# Patient Record
Sex: Male | Born: 1975 | Race: White | Hispanic: No | Marital: Married | Smoking: Never smoker
Health system: Southern US, Community
[De-identification: ages and names within clinical notes are randomized; demographics above are authoritative.]

## PROBLEM LIST (undated history)

## (undated) DIAGNOSIS — F419 Anxiety disorder, unspecified: Secondary | ICD-10-CM

## (undated) DIAGNOSIS — J302 Other seasonal allergic rhinitis: Secondary | ICD-10-CM

## (undated) DIAGNOSIS — G4730 Sleep apnea, unspecified: Secondary | ICD-10-CM

## (undated) HISTORY — DX: Other seasonal allergic rhinitis: J30.2

## (undated) HISTORY — DX: Anxiety disorder, unspecified: F41.9

## (undated) HISTORY — DX: Sleep apnea, unspecified: G47.30

---

## 2017-08-31 ENCOUNTER — Encounter: Payer: Self-pay | Admitting: Emergency Medicine

## 2017-08-31 ENCOUNTER — Emergency Department (INDEPENDENT_AMBULATORY_CARE_PROVIDER_SITE_OTHER): Payer: BLUE CROSS/BLUE SHIELD

## 2017-08-31 ENCOUNTER — Emergency Department (INDEPENDENT_AMBULATORY_CARE_PROVIDER_SITE_OTHER)
Admission: EM | Admit: 2017-08-31 | Discharge: 2017-08-31 | Disposition: A | Discharge status: Home / Self Care | Payer: BLUE CROSS/BLUE SHIELD | Source: Home / Self Care

## 2017-08-31 DIAGNOSIS — X58XXXA Exposure to other specified factors, initial encounter: Secondary | ICD-10-CM | POA: Diagnosis not present

## 2017-08-31 DIAGNOSIS — S99922A Unspecified injury of left foot, initial encounter: Secondary | ICD-10-CM

## 2017-08-31 MED ORDER — IBUPROFEN 800 MG PO TABS: 800.0000 mg | ORAL_TABLET | Freq: Two times a day (BID) | ORAL | 0 refills | Status: DC | PRN

## 2017-08-31 NOTE — Discharge Instructions (Addendum)
Imaging today was negative for fracture. I recommend that you continue to apply ice and taken ibuprofen 800 mg twice daily as needed for pain and to reduce inflammation associated with injury. If no improvement, return for follow-up. Take ibuprofen no more than 5 days as there is a risk of GI bleeding associated with prolonged intake of NSAIDS especially at higher doses.

## 2017-08-31 NOTE — ED Triage Notes (Signed)
Pt c/o left big toe injury x 2 days ago, bruised and painful, can move toe, taking Ibuprofen.

## 2017-08-31 NOTE — ED Provider Notes (Signed)
[facility]    CSN: [id number] Arrival date & time: 08/31/17  1213   History   Chief Complaint Chief Complaint  Patient presents with  . Toe Injury   HPI Samuel Frost is a 42 y.o. male since for evaluation of a left great toe injury which occurred 2 days ago. Patient reports while washing his car he lost his balance and in order to brace himself to prevent a fall he inadvertently hit his great toe on the cement.  He noticed over the course of the last 2 days increasing darkening of his great toe below the cuticle area extending down the entire toe. He is able to move great toe although reports the sensation of tightness and characterizes pain as "numbing pain" which is tolerable during the day with weightbearing and activity  throughout the day, pain worsens at night. He has taken ibuprofen once with relief of pain. His major concern is today is ruling out a toe fracture. Home Medications    Prior to Admission medications   Not on File   Family History No family history on file.  Social History Social History   Tobacco Use  . Smoking status: Not on file  Substance Use Topics  . Alcohol use: Not on file  . Drug use: Not on file     Allergies   Patient has no known allergies.   Review of Systems Review of Systems Pertinent negatives listed in HPI  Physical Exam Triage Vital Signs ED Triage Vitals [08/31/17 1255]  Enc Vitals Group     BP 120/80     Pulse Rate 79     Resp      Temp 98.5 F (36.9 C)     Temp Source Oral     SpO2 99 %     Weight 286 lb 8 oz (130 kg)     Height 6' (1.829 m)     Head Circumference      Peak Flow      Pain Score 6     Pain Loc      Pain Edu?      Excl. in [place]?    No data found.  Updated Vital Signs BP 120/80 (BP Location: Right Arm)   Pulse 79   Temp 98.5 F (36.9 C) (Oral)   Ht 6' (1.829 m)   Wt 286 lb 8 oz (130 kg)   SpO2 99%   BMI 38.86 kg/m   Visual Acuity Right Eye Distance:   Left Eye Distance:     Bilateral Distance:    Right Eye Near:   Left Eye Near:    Bilateral Near:     Physical Exam  Constitutional: He appears well-developed and well-nourished.  Cardiovascular: Normal rate.  Pulmonary/Chest: Effort normal.  Musculoskeletal:       Feet:     UC Treatments / Results  Labs (all labs ordered are listed, but only abnormal results are displayed) Labs Reviewed - No data to display  EKG None  Radiology Dg Toe Great Left  Result Date: 08/31/2017 CLINICAL DATA:  Left great toe injury. EXAM: LEFT GREAT TOE COMPARISON:  None. FINDINGS: There is no evidence of fracture or dislocation. There is no evidence of arthropathy or other focal bone abnormality. Soft tissues are unremarkable. IMPRESSION: Negative. Electronically Signed   By: Monte Fantasia M.D.   On: 08/31/2017 13:42    Procedures Procedures (including critical care time)  Medications Ordered in UC Medications - No data to display  Initial Impression / Assessment and Plan / UC Course  I have reviewed the triage vital signs and the nursing notes.  Pertinent labs & imaging results that were available during my care of the patient were reviewed by me and considered in my medical decision making (see chart for details).  Patient presents today concern for right great toe fracture following an injury he sustained during a recent fall 2 days prior. Toe has visible light colored bruising,  Although toe  is unremarkable for swelling. Toe is warm indicating appropriate circulatory status. Imaging of right great toe was negative for fracture. Patient is able to walk with complete weight-bearing without pain. Main concern today was ruling out fracture. I recommend ice applications to toe, ibuprofen 800 mg twice daily for next 5 days as needed for pain and inflammation (may discontinue if pain resolves in less than 5 days). Continue weight-bearing as tolerated.   Final Clinical Impressions(s) / UC Diagnoses   Final  diagnoses:  Injury of toe on left foot, initial encounter     Discharge Instructions     Imaging today was negative for fracture. I recommend that you continue to apply ice and taken ibuprofen 800 mg twice daily as needed for pain and to reduce inflammation associated with injury. If no improvement, return for follow-up. Take ibuprofen no more than 5 days as there is a risk of GI bleeding associated with prolonged intake of NSAIDS especially at higher doses.    ED Prescriptions    Medication Sig Dispense Auth. Provider   ibuprofen (ADVIL,MOTRIN) 800 MG tablet Take 1 tablet (800 mg total) by mouth 2 (two) times daily as needed. 20 tablet Scot Jun, FNP     Controlled Substance Prescriptions [place] Controlled Substance Registry consulted? No   Scot Jun, Trainer 09/01/17 1103

## 2018-02-04 HISTORY — PX: BRAIN AVM REPAIR: SHX202

## 2019-01-19 IMAGING — DX DG TOE GREAT 2+V*L*
3 series · 3 of 3 positions shown · non-contrast
Comparison: None.

CLINICAL DATA: Left great toe injury.

EXAM:
LEFT GREAT TOE

[toe ap]
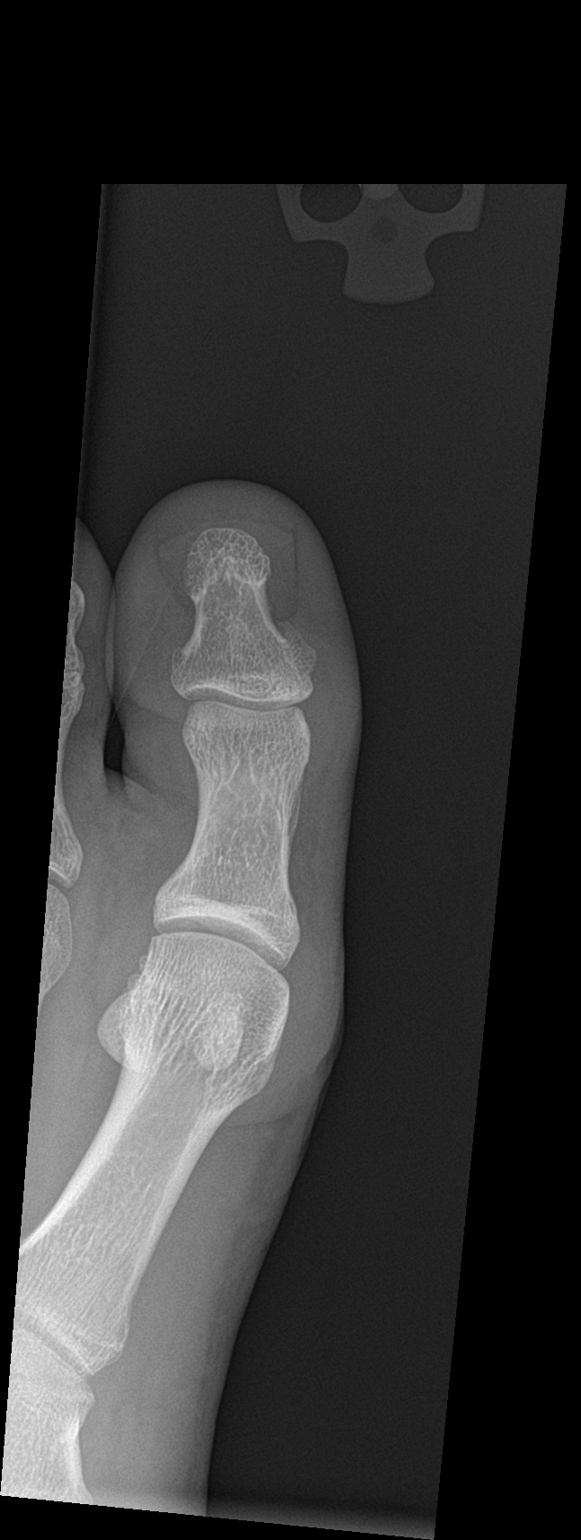

[toe obl]
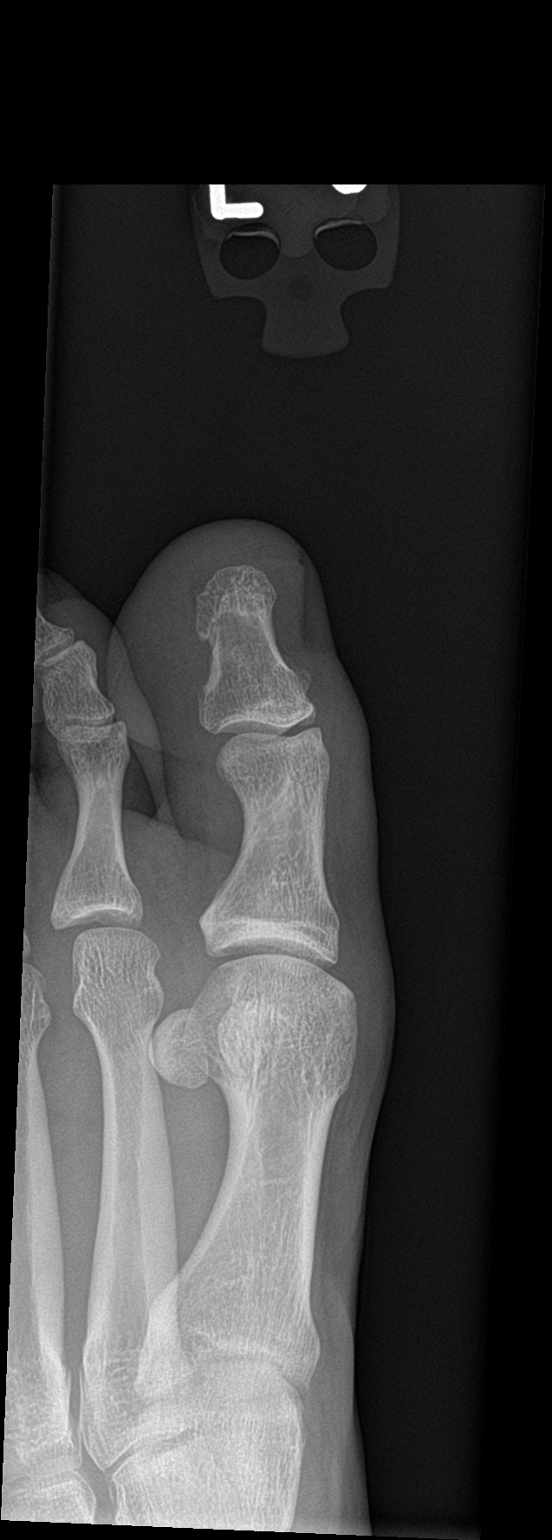

[toe lat]
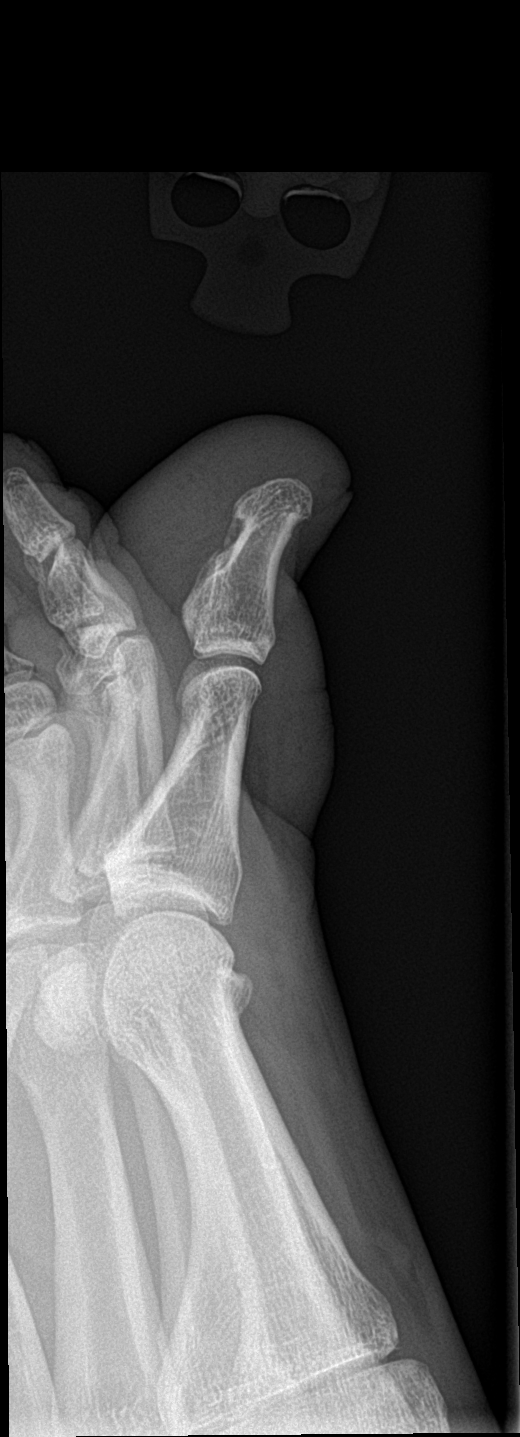

[3 of 3 positions shown; findings below may reference images not displayed]

FINDINGS: There is no evidence of fracture or dislocation. There is no
evidence of arthropathy or other focal bone abnormality. Soft
tissues are unremarkable.
IMPRESSION: Negative.

## 2020-02-05 HISTORY — PX: LIPOMA EXCISION: SHX5283

## 2021-11-09 ENCOUNTER — Encounter: Payer: Self-pay | Admitting: Gastroenterology

## 2021-11-28 ENCOUNTER — Ambulatory Visit (AMBULATORY_SURGERY_CENTER): Payer: Self-pay

## 2021-11-28 VITALS — Ht 72.0 in | Wt 282.0 lb

## 2021-11-28 DIAGNOSIS — Z1211 Encounter for screening for malignant neoplasm of colon: Secondary | ICD-10-CM

## 2021-11-28 MED ORDER — NA SULFATE-K SULFATE-MG SULF 17.5-3.13-1.6 GM/177ML PO SOLN: 1.0000 | Freq: Once | ORAL | 0 refills | Status: AC

## 2021-11-28 NOTE — Progress Notes (Signed)
No egg or soy allergy known to patient;  No issues known to pt with past sedation with any surgeries or procedures; Patient denies ever being told they had issues or difficulty with intubation;  No FH of Malignant Hyperthermia; Pt is not on diet pills; Pt is not on home 02;  Pt is not on blood thinners;  Pt denies issues with constipation;  No A fib or A flutter; Have any cardiac testing pending--NO Pt instructed to use Singlecare[employer]com or GoodRx for a price reduction on prep;   Insurance verified during PV appt=[employer]  Patient's chart reviewed by John Nulty CNRA prior to previsit and patient appropriate for the [facility].  Previsit completed and red dot placed by patient's name on their procedure day (on provider's schedule).     

## 2021-12-06 ENCOUNTER — Encounter: Payer: Self-pay | Admitting: Gastroenterology

## 2021-12-13 ENCOUNTER — Encounter: Payer: Self-pay | Admitting: Gastroenterology

## 2021-12-13 ENCOUNTER — Ambulatory Visit (AMBULATORY_SURGERY_CENTER): Payer: BC Managed Care – PPO | Admitting: Gastroenterology

## 2021-12-13 VITALS — BP 120/84 | HR 71 | Temp 97.5°F | Resp 20 | Ht 72.0 in | Wt 282.0 lb

## 2021-12-13 DIAGNOSIS — Z1211 Encounter for screening for malignant neoplasm of colon: Secondary | ICD-10-CM | POA: Diagnosis not present

## 2021-12-13 DIAGNOSIS — D124 Benign neoplasm of descending colon: Secondary | ICD-10-CM | POA: Diagnosis not present

## 2021-12-13 MED ORDER — SODIUM CHLORIDE 0.9 % IV SOLN: 500.0000 mL | Freq: Once | INTRAVENOUS | Status: DC

## 2021-12-13 NOTE — Progress Notes (Signed)
Called to room to assist during endoscopic procedure.  Patient ID and intended procedure confirmed with present staff. Received instructions for my participation in the procedure from the performing physician.  

## 2021-12-13 NOTE — Progress Notes (Signed)
[facility] History and Physical   Primary Care Physician:  Verdell Carmine., MD   Reason for Procedure:   CRC Screening  Plan:    colon     HPI: Samuel Frost is a 46 y.o. male    Past Medical History:  Diagnosis Date   Anxiety    on meds   Seasonal allergies    Sleep apnea    does not use CPAP    Past Surgical History:  Procedure Laterality Date   BRAIN AVM REPAIR  2020   LIPOMA EXCISION  2022   x 2 surgeries off of neck    Prior to Admission medications   Medication Sig Start Date End Date Taking? Authorizing Provider  Cholecalciferol (VITAMIN D3 PO) Take 400 Int'l Units/day by mouth daily at 6 (six) AM.   Yes [provider]  levocetirizine (XYZAL) 5 MG tablet Take 1 tablet by mouth daily.   Yes [provider]  Omega-3 Fatty Acids (SEA-OMEGA PO) Take 1 capsule by mouth daily at 2 am. 07/17/20  Yes [provider]  sertraline (ZOLOFT) 50 MG tablet Take 50 mg by mouth daily.   Yes [provider]    Current Outpatient Medications  Medication Sig Dispense Refill   Cholecalciferol (VITAMIN D3 PO) Take 400 Int'l Units/day by mouth daily at 6 (six) AM.     levocetirizine (XYZAL) 5 MG tablet Take 1 tablet by mouth daily.     Omega-3 Fatty Acids (SEA-OMEGA PO) Take 1 capsule by mouth daily at 2 am.     sertraline (ZOLOFT) 50 MG tablet Take 50 mg by mouth daily.     No current facility-administered medications for this visit.    Allergies as of 12/13/2021   (No Known Allergies)    Family History  Problem Relation Age of Onset   Stomach cancer Mother 38   Colon polyps Neg Hx    Colon cancer Neg Hx    Esophageal cancer Neg Hx    Rectal cancer Neg Hx     Social History   Socioeconomic History   Marital status: Married    Spouse name: Not on file   Number of children: Not on file   Years of education: Not on file   Highest education level: Not on file  Occupational History   Not on file  Tobacco Use    Smoking status: Never   Smokeless tobacco: Never  Vaping Use   Vaping Use: Never used  Substance and Sexual Activity   Alcohol use: Never   Drug use: Never   Sexual activity: Not on file  Other Topics Concern   Not on file  Social History Narrative   Not on file   Social Determinants of Health   Financial Resource Strain: Not on file  Food Insecurity: Not on file  Transportation Needs: Not on file  Physical Activity: Not on file  Stress: Not on file  Social Connections: Not on file  Intimate Partner Violence: Not on file    Review of Systems: Positive for none All other review of systems negative except as mentioned in the HPI.  Physical Exam: Vital signs in last 24 hours: '@VSRANGES'$ @   General:   Alert,  Well-developed, well-nourished, pleasant and cooperative in NAD Lungs:  Clear throughout to auscultation.   Heart:  Regular rate and rhythm; no murmurs, clicks, rubs,  or gallops. Abdomen:  Soft, nontender and nondistended. Normal bowel sounds.   Neuro/Psych:  Alert and cooperative. Normal mood and affect.  A and O x 3    No significant changes were identified.  The patient continues to be an appropriate candidate for the planned procedure and anesthesia.   Carmell Austria, MD. [facility] 12/13/2021 11:57 AM@

## 2021-12-13 NOTE — Progress Notes (Signed)
Pt's states no medical or surgical changes since previsit or office visit. 

## 2021-12-13 NOTE — Op Note (Signed)
[place] Patient Name: Samuel Frost Procedure Date: 12/13/2021 10:14 AM MRN: [id number] Endoscopist: Jackquline Denmark , MD, [id number] Age: 46 Referring MD:  Date of Birth: [birth year] Gender: Male Account #: [id number] Procedure:                Colonoscopy Indications:              Screening for colorectal malignant neoplasm Medicines:                Monitored Anesthesia Care Procedure:                Pre-Anesthesia Assessment:                           - Prior to the procedure, a History and Physical                            was performed, and patient medications and                            allergies were reviewed. The patient's tolerance of                            previous anesthesia was also reviewed. The risks                            and benefits of the procedure and the sedation                            options and risks were discussed with the patient.                            All questions were answered, and informed consent                            was obtained. Prior Anticoagulants: The patient has                            taken no anticoagulant or antiplatelet agents. ASA                            Grade Assessment: II - A patient with mild systemic                            disease. After reviewing the risks and benefits,                            the patient was deemed in satisfactory condition to                            undergo the procedure.                           After obtaining informed consent, the colonoscope  was passed under direct vision. Throughout the                            procedure, the patient's blood pressure, pulse, and                            oxygen saturations were monitored continuously. The                            CF [id number] #[id number] was introduced through the anus                            and advanced to the 2 cm into the ileum. The                            colonoscopy was  performed without difficulty. The                            patient tolerated the procedure well. The quality                            of the bowel preparation was good. The terminal                            ileum, ileocecal valve, appendiceal orifice, and                            rectum were photographed. Scope In: 10:31:35 AM Scope Out: 10:44:35 AM Scope Withdrawal Time: 0 hours 10 minutes 59 seconds  Total Procedure Duration: 0 hours 13 minutes 0 seconds  Findings:                 An 8 mm polyp was found in the mid descending                            colon. The polyp was sessile. The polyp was removed                            with a cold snare. Resection and retrieval were                            complete.                           A few rare (2-3) small-mouthed diverticula were                            found in the sigmoid colon.                           Non-bleeding internal hemorrhoids were found during                            retroflexion. The hemorrhoids were small and Grade  I (internal hemorrhoids that do not prolapse).                           The terminal ileum appeared normal.                           The exam was otherwise without abnormality on                            direct and retroflexion views. Complications:            No immediate complications. Estimated Blood Loss:     Estimated blood loss: none. Impression:               - One 8 mm polyp in the mid descending colon,                            removed with a cold snare. Resected and retrieved.                           - Minimal sigmoid diverticulosis                           - Non-bleeding internal hemorrhoids.                           - The examined portion of the ileum was normal.                           - The examination was otherwise normal on direct                            and retroflexion views. Recommendation:           - Patient has a contact  number available for                            emergencies. The signs and symptoms of potential                            delayed complications were discussed with the                            patient. Return to normal activities tomorrow.                            Written discharge instructions were provided to the                            patient.                           - High fiber diet.                           - Continue present medications.                           -  Await pathology results.                           - Repeat colonoscopy for surveillance based on                            pathology results.                           - The findings and recommendations were discussed                            with the patient's family. Jackquline Denmark, MD 12/13/2021 10:48:29 AM This report has been signed electronically.

## 2021-12-13 NOTE — Patient Instructions (Signed)
Handout on polyps given. High fiber diet. Continue present medications. Repeat colonoscopy for surveillance based on pathology results.   YOU HAD AN ENDOSCOPIC PROCEDURE TODAY AT [facility]:   Refer to the procedure report that was given to you for any specific questions about what was found during the examination.  If the procedure report does not answer your questions, please call your gastroenterologist to clarify.  If you requested that your care partner not be given the details of your procedure findings, then the procedure report has been included in a sealed envelope for you to review at your convenience later.  YOU SHOULD EXPECT: Some feelings of bloating in the abdomen. Passage of more gas than usual.  Walking can help get rid of the air that was put into your GI tract during the procedure and reduce the bloating. If you had a lower endoscopy (such as a colonoscopy or flexible sigmoidoscopy) you may notice spotting of blood in your stool or on the toilet paper. If you underwent a bowel prep for your procedure, you may not have a normal bowel movement for a few days.  Please Note:  You might notice some irritation and congestion in your nose or some drainage.  This is from the oxygen used during your procedure.  There is no need for concern and it should clear up in a day or so.  SYMPTOMS TO REPORT IMMEDIATELY:  Following lower endoscopy (colonoscopy or flexible sigmoidoscopy):  Excessive amounts of blood in the stool  Significant tenderness or worsening of abdominal pains  Swelling of the abdomen that is new, acute  Fever of 100F or higher   For urgent or emergent issues, a gastroenterologist can be reached at any hour by calling [phone]. Do not use MyChart messaging for urgent concerns.    DIET:  We do recommend a small meal at first, but then you may proceed to your regular diet.  Drink plenty of fluids but you should avoid alcoholic beverages for 24  hours.  ACTIVITY:  You should plan to take it easy for the rest of today and you should NOT DRIVE or use heavy machinery until tomorrow (because of the sedation medicines used during the test).    FOLLOW UP: Our staff will call the number listed on your records the next business day following your procedure.  We will call around 7:15- 8:00 am to check on you and address any questions or concerns that you may have regarding the information given to you following your procedure. If we do not reach you, we will leave a message.     If any biopsies were taken you will be contacted by phone or by letter within the next 1-3 weeks.  Please call us at [phone] if you have not heard about the biopsies in 3 weeks.    SIGNATURES/CONFIDENTIALITY: You and/or your care partner have signed paperwork which will be entered into your electronic medical record.  These signatures attest to the fact that that the information above on your After Visit Summary has been reviewed and is understood.  Full responsibility of the confidentiality of this discharge information lies with you and/or your care-partner.

## 2021-12-13 NOTE — Progress Notes (Signed)
Sedate, gd SR, tolerated procedure well, VSS, report to RN 

## 2021-12-14 ENCOUNTER — Telehealth: Payer: Self-pay | Admitting: *Deleted

## 2021-12-14 NOTE — Telephone Encounter (Signed)
No answer on  follow up call. Left message.   

## 2021-12-23 ENCOUNTER — Encounter: Payer: Self-pay | Admitting: Gastroenterology

## 2023-06-07 ENCOUNTER — Ambulatory Visit: Admission: EM | Admit: 2023-06-07 | Discharge: 2023-06-07 | Disposition: A | Discharge status: Home / Self Care

## 2023-06-07 DIAGNOSIS — M7989 Other specified soft tissue disorders: Secondary | ICD-10-CM

## 2023-06-07 DIAGNOSIS — R21 Rash and other nonspecific skin eruption: Secondary | ICD-10-CM | POA: Diagnosis not present

## 2023-06-07 MED ORDER — METHYLPREDNISOLONE SODIUM SUCC 125 MG IJ SOLR
125.0000 mg | Freq: Once | INTRAMUSCULAR | Status: AC
  Administered 2023-06-07: 125 mg via INTRAMUSCULAR

## 2023-06-07 MED ORDER — PREDNISONE 10 MG (21) PO TBPK: ORAL_TABLET | Freq: Every day | ORAL | 0 refills | Status: AC

## 2023-06-07 MED ORDER — FAMOTIDINE 20 MG PO TABS
20.0000 mg | ORAL_TABLET | ORAL | Status: AC
  Administered 2023-06-07: 20 mg via ORAL

## 2023-06-07 MED ORDER — FAMOTIDINE IN NACL 20-0.9 MG/50ML-% IV SOLN: 20.0000 mg | INTRAVENOUS | Status: DC

## 2023-06-07 NOTE — ED Triage Notes (Signed)
 Pt c/o RT hand swelling that started this am. Says he had a rash all over his adb and legs yesterday. Woke up with hand swelling and joint pain. Felt like he was having trouble swallowing. 2 Benedryl at 930 this am. Just had dental implant a week and a half ago, finished abx thurs.

## 2023-06-07 NOTE — ED Provider Notes (Signed)
 [facility]    CSN: [id number] Arrival date & time: 06/07/23  1326      History   Chief Complaint Chief Complaint  Patient presents with   Hand swelling    Possible allergic rxn    HPI Samuel Frost is a 48 y.o. male.   HPI 48 year old male presents with possible allergic reaction.  Patient presents with right hand swelling and rash of body since yesterday.  Patient denies new foods, beverages soaps, laundry detergents, emollients, or creams.  Denies exposure to new pets.  PMH significant for morbid/severe obesity, anxiety, and sleep apnea.  Past Medical History:  Diagnosis Date   Anxiety    on meds   Seasonal allergies    Sleep apnea    does not use CPAP    There are no active problems to display for this patient.   Past Surgical History:  Procedure Laterality Date   BRAIN AVM REPAIR  2020   LIPOMA EXCISION  2022   x 2 surgeries off of neck       Home Medications    Prior to Admission medications   Medication Sig Start Date End Date Taking? Authorizing Provider  predniSONE (STERAPRED UNI-PAK 21 TAB) 10 MG (21) TBPK tablet Take by mouth daily. Take 6 tabs by mouth daily  for 2 days, then 5 tabs for 2 days, then 4 tabs for 2 days, then 3 tabs for 2 days, 2 tabs for 2 days, then 1 tab by mouth daily for 2 days 06/07/23  Yes Leonides Ramp, FNP  Cholecalciferol (VITAMIN D3 PO) Take 400 Int'l Units/day by mouth daily at 6 (six) AM.    [provider]  levocetirizine (XYZAL) 5 MG tablet Take 1 tablet by mouth daily.    [provider]  Omega-3 Fatty Acids (SEA-OMEGA PO) Take 1 capsule by mouth daily at 2 am. 07/17/20   [provider]  pramipexole (MIRAPEX) 0.25 MG tablet Take 0.25 mg by mouth daily.    [provider]  sertraline (ZOLOFT) 50 MG tablet Take 50 mg by mouth daily.    [provider]    Family History Family History  Problem Relation Age of Onset   Stomach cancer Mother 26   Colon polyps Neg Hx     Colon cancer Neg Hx    Esophageal cancer Neg Hx    Rectal cancer Neg Hx     Social History Social History   Tobacco Use   Smoking status: Never   Smokeless tobacco: Never  Vaping Use   Vaping status: Never Used  Substance Use Topics   Alcohol use: Never   Drug use: Never     Allergies   Patient has no known allergies.   Review of Systems Review of Systems  Musculoskeletal:        Right hand swelling x 1 day   Skin:  Positive for rash.     Physical Exam Triage Vital Signs ED Triage Vitals  Encounter Vitals Group     BP      Systolic BP Percentile      Diastolic BP Percentile      Pulse      Resp      Temp      Temp src      SpO2      Weight      Height      Head Circumference      Peak Flow      Pain Score  Pain Loc      Pain Education      Exclude from Growth Chart    No data found.  Updated Vital Signs BP 134/86 (BP Location: Right Arm)   Pulse 75   Temp 98.1 F (36.7 C) (Oral)   Resp 17   SpO2 99%    Physical Exam Vitals and nursing note reviewed.  Constitutional:      Appearance: Normal appearance. He is obese. He is not ill-appearing.  HENT:     Head: Normocephalic and atraumatic.     Mouth/Throat:     Mouth: Mucous membranes are moist.     Pharynx: Oropharynx is clear.  Eyes:     Conjunctiva/sclera: Conjunctivae normal.     Pupils: Pupils are equal, round, and reactive to light.  Cardiovascular:     Rate and Rhythm: Normal rate and regular rhythm.     Pulses: Normal pulses.     Heart sounds: Normal heart sounds.  Pulmonary:     Effort: Pulmonary effort is normal.     Breath sounds: Normal breath sounds. No wheezing, rhonchi or rales.  Musculoskeletal:        General: Normal range of motion.     Cervical back: Normal range of motion and neck supple.     Comments: Right hand: Moderate soft tissue swelling noted  Skin:    General: Skin is warm and dry.     Comments: Left lower arm: Maculopapular eruption, pruritic in  nature per patient please see image below  Neurological:     General: No focal deficit present.     Mental Status: He is alert and oriented to person, place, and time. Mental status is at baseline.  Psychiatric:        Mood and Affect: Mood normal.        Behavior: Behavior normal.         UC Treatments / Results  Labs (all labs ordered are listed, but only abnormal results are displayed) Labs Reviewed - No data to display  EKG   Radiology No results found.  Procedures Procedures (including critical care time)  Medications Ordered in UC Medications  methylPREDNISolone sodium succinate (SOLU-MEDROL) 125 mg/2 mL injection 125 mg (125 mg Intramuscular Given 06/07/23 1416)  famotidine (PEPCID) tablet 20 mg (20 mg Oral Given 06/07/23 1416)    Initial Impression / Assessment and Plan / UC Course  I have reviewed the triage vital signs and the nursing notes.  Pertinent labs & imaging results that were available during my care of the patient were reviewed by me and considered in my medical decision making (see chart for details).     MDM: 1.  Swelling of right hand-IM Solu-Medrol 125 mg given once in clinic and prior to discharge, Pepcid 20 mg given once in clinic and prior to discharge; 2.  Rash nonspecific skin eruption-Rx'd Sterapred Unipak (21 tab 10 mg) Advised patient to take medication as directed with food to completion.  Encouraged to increase daily water intake to 64 ounces per day while taking his medication.  Advised if symptoms worsen and/or unresolved please follow-up with your PCP, go to nearest ED, or here for further evaluation.  Patient discharged home, hemodynamically stable. Final Clinical Impressions(s) / UC Diagnoses   Final diagnoses:  Rash and nonspecific skin eruption  Swelling of right hand     Discharge Instructions      Advised patient to take medication as directed with food to completion.  Encouraged to increase daily water  intake to 64 ounces  per day while taking his medication.  Advised if symptoms worsen and/or unresolved please follow-up with your PCP, go to nearest ED, or here for further evaluation.     ED Prescriptions     Medication Sig Dispense Auth. Provider   predniSONE (STERAPRED UNI-PAK 21 TAB) 10 MG (21) TBPK tablet Take by mouth daily. Take 6 tabs by mouth daily  for 2 days, then 5 tabs for 2 days, then 4 tabs for 2 days, then 3 tabs for 2 days, 2 tabs for 2 days, then 1 tab by mouth daily for 2 days 42 tablet Leonides Ramp, FNP      PDMP not reviewed this encounter.   Leonides Ramp, FNP 06/07/23 1431

## 2023-06-07 NOTE — Discharge Instructions (Addendum)
 Advised patient to take medication as directed with food to completion.  Encouraged to increase daily water intake to 64 ounces per day while taking his medication.  Advised if symptoms worsen and/or unresolved please follow-up with your PCP, go to nearest ED, or here for further evaluation.
